# Patient Record
Sex: Female | Born: 1960 | Race: White | Hispanic: No | Marital: Married | Smoking: Never smoker
Health system: Southern US, Community
[De-identification: ages and names within clinical notes are randomized; demographics above are authoritative.]

## PROBLEM LIST (undated history)

## (undated) DIAGNOSIS — J45909 Unspecified asthma, uncomplicated: Secondary | ICD-10-CM

## (undated) DIAGNOSIS — E7800 Pure hypercholesterolemia, unspecified: Secondary | ICD-10-CM

## (undated) HISTORY — PX: TONSILLECTOMY: SUR1361

## (undated) HISTORY — DX: Pure hypercholesterolemia, unspecified: E78.00

---

## 1998-03-16 ENCOUNTER — Other Ambulatory Visit: Admission: RE | Admit: 1998-03-16 | Discharge: 1998-03-16 | Payer: Self-pay | Admitting: Obstetrics and Gynecology

## 1999-03-29 ENCOUNTER — Other Ambulatory Visit: Admission: RE | Admit: 1999-03-29 | Discharge: 1999-03-29 | Payer: Self-pay | Admitting: Obstetrics and Gynecology

## 2000-04-18 ENCOUNTER — Other Ambulatory Visit: Admission: RE | Admit: 2000-04-18 | Discharge: 2000-04-18 | Payer: Self-pay | Admitting: Obstetrics and Gynecology

## 2001-04-23 ENCOUNTER — Other Ambulatory Visit: Admission: RE | Admit: 2001-04-23 | Discharge: 2001-04-23 | Payer: Self-pay | Admitting: Obstetrics and Gynecology

## 2001-09-11 ENCOUNTER — Ambulatory Visit (HOSPITAL_COMMUNITY): Admission: RE | Admit: 2001-09-11 | Discharge: 2001-09-11 | Payer: Self-pay | Admitting: Family Medicine

## 2001-09-11 ENCOUNTER — Encounter: Payer: Self-pay | Admitting: Family Medicine

## 2002-07-10 ENCOUNTER — Other Ambulatory Visit: Admission: RE | Admit: 2002-07-10 | Discharge: 2002-07-10 | Payer: Self-pay | Admitting: Obstetrics and Gynecology

## 2003-08-26 ENCOUNTER — Other Ambulatory Visit: Admission: RE | Admit: 2003-08-26 | Discharge: 2003-08-26 | Payer: Self-pay | Admitting: Obstetrics and Gynecology

## 2004-09-29 ENCOUNTER — Other Ambulatory Visit: Admission: RE | Admit: 2004-09-29 | Discharge: 2004-09-29 | Payer: Self-pay | Admitting: Obstetrics and Gynecology

## 2005-11-22 ENCOUNTER — Other Ambulatory Visit: Admission: RE | Admit: 2005-11-22 | Discharge: 2005-11-22 | Payer: Self-pay | Admitting: Obstetrics and Gynecology

## 2007-02-16 ENCOUNTER — Ambulatory Visit (HOSPITAL_COMMUNITY): Admission: RE | Admit: 2007-02-16 | Discharge: 2007-02-16 | Payer: Self-pay | Admitting: Obstetrics and Gynecology

## 2009-04-23 ENCOUNTER — Ambulatory Visit (HOSPITAL_COMMUNITY): Admission: RE | Admit: 2009-04-23 | Discharge: 2009-04-23 | Payer: Self-pay | Admitting: Obstetrics and Gynecology

## 2010-10-18 ENCOUNTER — Encounter: Payer: Self-pay | Admitting: Obstetrics and Gynecology

## 2011-01-02 LAB — CBC
HCT: 37.8 % (ref 36.0–46.0)
Hemoglobin: 13.2 g/dL (ref 12.0–15.0)
MCHC: 35 g/dL (ref 30.0–36.0)
MCV: 96.9 fL (ref 78.0–100.0)
Platelets: 214 10*3/uL (ref 150–400)
RBC: 3.91 MIL/uL (ref 3.87–5.11)
RDW: 13 % (ref 11.5–15.5)
WBC: 8 10*3/uL (ref 4.0–10.5)

## 2011-01-02 LAB — COMPREHENSIVE METABOLIC PANEL
ALT: 15 U/L (ref 0–35)
AST: 17 U/L (ref 0–37)
Albumin: 4.3 g/dL (ref 3.5–5.2)
Alkaline Phosphatase: 51 U/L (ref 39–117)
BUN: 11 mg/dL (ref 6–23)
CO2: 29 mEq/L (ref 19–32)
Calcium: 9.5 mg/dL (ref 8.4–10.5)
Chloride: 101 mEq/L (ref 96–112)
Creatinine, Ser: 0.75 mg/dL (ref 0.4–1.2)
GFR calc Af Amer: >60 mL/min (ref >60)
GFR calc non Af Amer: >60 mL/min (ref >60)
Glucose, Bld: 87 mg/dL (ref 70–99)
Potassium: 3.7 mEq/L (ref 3.5–5.1)
Sodium: 136 mEq/L (ref 135–145)
Total Bilirubin: 1.6 mg/dL — ABNORMAL HIGH (ref 0.3–1.2)
Total Protein: 7.4 g/dL (ref 6.0–8.3)

## 2011-01-02 LAB — PREGNANCY, URINE: Preg Test, Ur: NEGATIVE

## 2011-02-08 NOTE — Op Note (Signed)
NAMEMarland Kitchen  Marissa Jensen, Marissa Jensen            ACCOUNT NO.:  [id number]   MEDICAL RECORD NO.:  [id number]          PATIENT TYPE:  AMB   LOCATION:  SDC                           FACILITY:  [facility]   PHYSICIAN:  Duke Salvia. Marcelle Overlie, M.D.DATE OF BIRTH:  06/03/1961   DATE OF PROCEDURE:  04/23/2009  DATE OF DISCHARGE:  04/23/2009                               OPERATIVE REPORT   PREOPERATIVE DIAGNOSIS:  Possible left adnexal mass.   POSTOPERATIVE DIAGNOSIS:  Minimal endometriosis.  No mass noted.   PROCEDURE:  Diagnostic laparoscopy, cystoscopy.   SURGEON:  Duke Salvia. Marcelle Overlie, MD   ANESTHESIA:  General endotracheal.   COMPLICATIONS:  None.   DRAINS:  In-and-out Foley catheter.   BLOOD LOSS:  Minimal.   PROCEDURE AND FINDINGS:  The patient was taken to the operating room.  After an adequate level of general endotracheal anesthesia was obtained  with the patient's legs in stirrups, the abdomen, perineum, and vagina  prepped and draped in usual manner for laparoscopy.  The bladder was  drained.  Careful EUA was carried out first with a speculum exam.  There  was not a Gartner's duct cyst or any other abnormality I could detect on  exam, especially on the left fornix area.  The area over the prior TOT  was palpably normal.  There was no exposed mesh on careful exam.  On  EUA, the uterus was anterior, normal size, mobile.  Adnexa unremarkable  up to the sidewall and each side I could not palpate any abnormality.  Hulka tenaculum was positioned.  Attention directed to the abdomen.  The  subumbilical area was infiltrated with 0.25% Marcaine plain.  A small  incision was made and the Veress needle was introduced without  difficulty.  Its intra-abdominal position verified by pressure and water  testing.  After 2.5 L pneumoperitoneum was then created, laparoscopic  trocar and sleeve were then introduced without difficulty.  Three  fingerbreadths above the symphysis in the midline, a 5-mm trocar was  inserted and then the patient placed in Trendelenburg.   Pelvic findings as follows:  The anterior and posterior cul-de-sac were  unremarkable.  There was some minimal endometriosis along the surface of  the left ovary.  One implant that was very minimal lateral to the right  uterosacral ligament.  Otherwise, the uterosacral ligaments and the  posterior cul-de-sac area was unremarkable.  The tubes and ovaries  bilaterally were normal.  Careful inspection using the blunt probe as to  facilitate palpation in the broad ligament, I could not detect a mass.  The area described on the left anatomic pelvis even near the bladder  area, I could not detect any abnormality.  There was a solitary  intramural fibroid posteriorly that was relatively small, 1.5 cm.  Otherwise, the serosa was unremarkable.  Careful inspection of the  bladder flap area out to the sidewall including the course of the ureter  on either side, I could not detect any abnormality.  There were some  periappendiceal adhesions and pericecal adhesions on the right may have  suggested a prior episode of a low-grade appendicitis or colitis, but  the appendix itself showed no evidence of any acute inflammation other  than the old adhesions.  After these findings were photo documented,  instruments removed, gas allowed to escape, defect closed with 4-0 Dexon  subcuticular sutures and Dermabond.  At this point, cystoscopy was  carried out with 30-degree lens.  The bladder was insufflated.  At  certain point, the flow was discontinued to allow for visualization.  Careful visualization of the bladder revealed slight irregularity at the  right ureter, but no mass, no polyp.  There were certainly no evidence  of mesh erosion into the bladder or other mass effect noted.  Scope was  removed.  The bladder was deflated.  She tolerated this well, went to  recovery room in good condition.      Richard M. Marcelle Overlie, M.D.  Electronically  Signed     RMH/MEDQ  D:  04/23/2009  T:  04/24/2009  Job:  [id number]

## 2011-02-08 NOTE — H&P (Signed)
NAME:  WAVERLY, TARQUINIO NO.:  [id number]   MEDICAL RECORD NO.:  [id number]           PATIENT TYPE:  AMB   LOCATION:                                [facility]:  [facility]   PHYSICIAN:  Duke Salvia. Marcelle Overlie, M.D.DATE OF BIRTH:  1961-07-02   DATE OF ADMISSION:  04/23/2009  DATE OF DISCHARGE:                              HISTORY & PHYSICAL   DATE OF SCHEDULED SURGERY:  April 23, 2009.   CHIEF COMPLAINT:  Left pelvic mass.   HISTORY OF PRESENT ILLNESS:  A 50 year old G2, P1 using condoms.  She  has a 53 year old daughter.  Last year, she had TOT for SUI and is  incontinent since that time.   Her GI doctor had ordered a CT scan to evaluate for some abdominal  discomfort and CT was read out as normal, except for showing a 3-cm left  adnexal mass that was separate from the ovary.  No other abnormalities  were noted.  We ordered a followup MRI to try to clarify that showed a  2.8 x 2.8 left periadnexal mass that was close to the bladder.  Her  pelvic exam was entirely normal.  Of note that her mother was diagnosed  in 2003 with ovarian cancer.  She presents at this time for diagnostic  laparoscopy and cystoscopy.  This procedure including risks related to  bleeding, infection, adjacent organ injury, the possible need for open  additional surgery all reviewed with her, which she understands and  accepts.   PAST MEDICAL HISTORY:   ALLERGIES:  None.   CURRENT MEDICATIONS:  Vytorin, calcium, and vitamin D.   OBSTETRICAL HISTORY:  One vaginal delivery in 1995, she had a TOT in  2008, a D and C in 1994.   REVIEW OF SYSTEMS:  Significant for hiatal hernia, history of IBS.   FAMILY HISTORY:  Significant for osteoporosis, diverticulosis, colon and  ovarian cancer.   SOCIAL HISTORY:  Denies smoking, alcohol, or drug use.  She is married,  has 1 child, age 32.   PHYSICAL EXAMINATION:  VITAL SIGNS:  Temperature 98.2, blood pressure  120/78.  HEENT:  Unremarkable.  NECK:  Supple  without masses.  LUNGS:  Clear.  CARDIOVASCULAR:  Regular rate and rhythm without murmurs, rubs, or  gallops.  BREASTS:  Without masses.  ABDOMEN:  Soft, flat, nontender.  PELVIC:  Normal external genitalia.  Vagina and cervix clear.  Uterus  mid positional size.  Adnexa negative.  No unusual tenderness nodularity  noted on careful exam.  She had colonoscopy done 4 years ago.   IMPRESSION:  Asymptomatic left periadnexal mass noted on CT and MRI.   PLAN:  Diagnostic laparoscopy with cystoscopy procedure and risks  reviewed as above.      Richard M. Marcelle Overlie, M.D.  Electronically Signed     RMH/MEDQ  D:  04/21/2009  T:  04/21/2009  Job:  [id number]

## 2011-02-11 NOTE — H&P (Signed)
NAMEMarland Jensen  ALYSSAMAE, KLINCK            ACCOUNT NO.:  [id number]   MEDICAL RECORD NO.:  [id number]          PATIENT TYPE:  AMB   LOCATION:  SDC                           [facility]   PHYSICIAN:  Duke Salvia. Marcelle Overlie, M.D.DATE OF BIRTH:  [birth year]   DATE OF ADMISSION:  DATE OF DISCHARGE:                              HISTORY & PHYSICAL   DATE OF SCHEDULED SURGERY:  Feb 16, 2007.   CHIEF COMPLAINT:  Stress urinary incontinence.   HISTORY OF PRESENT ILLNESS:  This 50 year old G2, P1 is using condoms  for contraception.  Does not plan another pregnancy.  The last year or  two she has noticed worsening problems with SUI.  Urodynamics in our  office was consistent with SUI.  She presents now for TOT.  This  procedure including risk of bleeding, infection, adjacent organ injury,  bladder irritability issues or the need for catheterization postop along  with her expected recovery time all discussed, which she understands and  accepts.   ALLERGIES:  NONE.   CURRENT MEDICATIONS:  MiraLax p.r.n., Nexium.   FAMILY HISTORY:  Significant for a mother who died with ovarian cancer.   REVIEW OF SYSTEMS:  She has had one prior miscarriage, one term  pregnancy, tonsillectomy, was diagnosed with a hiatal hernia in 1993 by  Dr. Jarold Motto.   PHYSICAL EXAMINATION:  VITAL SIGNS:  Temperature 98.2, blood pressure  120/78.  HEENT: Unremarkable.  NECK:  Supple without masses.  LUNGS:  Clear.  CARDIOVASCULAR:  Regular rate and rhythm without murmurs, rubs or  gallops.  BREASTS:  Without masses.  ABDOMEN:  Soft, flat, nontender.  PELVIC:  Normal external genitalia.  Vagina chamber is clear.  Uterus in  good position, normal size.  Adnexa negative.  EXTREMITIES:  Unremarkable.  NEUROLOGIC:  Unremarkable.   IMPRESSION:  Stress urinary incontinence.   PLAN:  TOT procedure and risk reviewed as above.      Richard M. Marcelle Overlie, M.D.  Electronically Signed     RMH/MEDQ  D:  02/15/2007  T:   02/15/2007  Job:  [id number]

## 2011-02-11 NOTE — Op Note (Signed)
NAMEMarland Jensen  ILDA, LASKIN            ACCOUNT NO.:  [id number]   MEDICAL RECORD NO.:  [id number]          PATIENT TYPE:  AMB   LOCATION:  SDC                           [facility]   PHYSICIAN:  Duke Salvia. Marcelle Overlie, M.D.DATE OF BIRTH:  1961-03-10   DATE OF PROCEDURE:  02/16/2007  DATE OF DISCHARGE:                               OPERATIVE REPORT   PREOPERATIVE DIAGNOSIS:  Stress urinary incontinence.   POSTOPERATIVE DIAGNOSIS:  Stress urinary incontinence.   PROCEDURE:  Transobturator tape sling, cystoscopy.   SURGEON:  [place].   ANESTHESIA:  General.   COMPLICATIONS:  None.   DRAINS:  In-and-out Foley catheter.   BLOOD LOSS:  Minimal.   PROCEDURE AND FINDINGS:  The patient was taken to the operating room  after an adequate level of general endotracheal anesthesia was obtained  with the patient's legs stirrups.  The lower abdomen, perineum and  vagina were prepped and draped in the usual manner for vaginal  procedures.  Weighted speculum was positioned.  The bladder was drained  at that point.  By palpation, marks were made at the junction of the  adductor longus muscle on the inferior ramus of the inferior pubic  ramus.  Small incisions were made in those areas and infiltrated with 1%  Xylocaine with epinephrine.  Next, a small incision was made at the mid  urethra in the midline, approximately 2 cm dissected a short way on each  side to allow passage of the surgeon's finger into the appropriate  space.  The halo needle was then positioned, passed through one side and  then the other.  Careful attention made that there was no buttonholing  of the vaginal mucosa.  Once the needles were positioned correctly,  cystoscopy was carried out revealing no entry into the bladder.  The  mesh was pulled through, was tensioned appropriately and trimmed at the  exit sites.  The exit sites were glued with Dermabond assuring that the  mesh was below skin level and the vaginal mucosa  reapproximated with 2-0  Vicryl Rapide sutures.  The bladder was partially drained at that point.  Clear urine was noted.  She tolerated this well, went to recovery room  in good condition.      Richard M. Marcelle Overlie, M.D.  Electronically Signed     RMH/MEDQ  D:  02/16/2007  T:  02/16/2007  Job:  [id number]

## 2013-07-25 ENCOUNTER — Other Ambulatory Visit: Payer: Self-pay | Admitting: Obstetrics and Gynecology

## 2013-07-25 DIAGNOSIS — R928 Other abnormal and inconclusive findings on diagnostic imaging of breast: Secondary | ICD-10-CM

## 2013-07-30 ENCOUNTER — Other Ambulatory Visit: Payer: Self-pay

## 2013-08-13 ENCOUNTER — Other Ambulatory Visit: Payer: Self-pay

## 2014-08-20 ENCOUNTER — Encounter: Payer: Self-pay | Admitting: Podiatrist

## 2014-08-20 ENCOUNTER — Ambulatory Visit (INDEPENDENT_AMBULATORY_CARE_PROVIDER_SITE_OTHER): Payer: BC Managed Care – PPO | Admitting: Podiatrist

## 2014-08-20 ENCOUNTER — Ambulatory Visit (INDEPENDENT_AMBULATORY_CARE_PROVIDER_SITE_OTHER): Payer: BC Managed Care – PPO

## 2014-08-20 DIAGNOSIS — M7751 Other enthesopathy of right foot: Secondary | ICD-10-CM

## 2014-08-20 DIAGNOSIS — R52 Pain, unspecified: Secondary | ICD-10-CM

## 2014-08-20 DIAGNOSIS — M778 Other enthesopathies, not elsewhere classified: Secondary | ICD-10-CM

## 2014-08-20 DIAGNOSIS — M779 Enthesopathy, unspecified: Secondary | ICD-10-CM

## 2014-08-20 NOTE — Progress Notes (Signed)
   Subjective:    Patient ID: Marissa Jensen, female    DOB: 1961/01/25, 53 y.o.   MRN: [id number]  HPI  PT STATED RT TOP FOOT IS BEEN ACHING FOR 1 YEAR. FOOT IS GETTING WORSE ESPECIALLY WEARING HIGH HEELS. TRIED INSERTS BUT NO HELP.  Review of Systems  Constitutional: Positive for fatigue and unexpected weight change.  Genitourinary: Positive for frequency.       Objective:   Physical Exam Patient is awake, alert, and oriented x 3.  In no acute distress.  Vascular status is intact with palpable pedal pulses at 2/4 DP and PT bilateral and capillary refill time within normal limits. Neurological sensation is also intact bilaterally via Semmes Weinstein monofilament at 5/5 sites. Light touch, vibratory sensation, Achilles tendon reflex is intact. Dermatological exam reveals skin color, turger and texture as normal. No open lesions present.  Musculature intact with dorsiflexion, plantarflexion, inversion, eversion.  Pain at the first metatarsal phalangeal joint region is present.  Discomfort is mostly with heel elevation of 2 inches and above.  Decreased range of motion is present.  Mild decreased joint space is noted on xray with an elevated first metatarsal seen on the lateral view.      Assessment & Plan:  Hallux limitus  Plan:  Discussed conservative options .  Discussed custom orthotics, injection therapy, shoe gear changes, antiinflammatories.  Will follow up for a recheck.

## 2015-06-25 ENCOUNTER — Ambulatory Visit (INDEPENDENT_AMBULATORY_CARE_PROVIDER_SITE_OTHER): Payer: BLUE CROSS/BLUE SHIELD

## 2015-06-25 ENCOUNTER — Ambulatory Visit (INDEPENDENT_AMBULATORY_CARE_PROVIDER_SITE_OTHER): Payer: BLUE CROSS/BLUE SHIELD | Admitting: Podiatry

## 2015-06-25 ENCOUNTER — Encounter: Payer: Self-pay | Admitting: Podiatry

## 2015-06-25 VITALS — BP 119/69 | HR 76 | Resp 16

## 2015-06-25 DIAGNOSIS — M79672 Pain in left foot: Secondary | ICD-10-CM

## 2015-06-25 DIAGNOSIS — M79671 Pain in right foot: Secondary | ICD-10-CM

## 2015-06-25 DIAGNOSIS — D3610 Benign neoplasm of peripheral nerves and autonomic nervous system, unspecified: Secondary | ICD-10-CM

## 2015-06-25 DIAGNOSIS — M779 Enthesopathy, unspecified: Secondary | ICD-10-CM | POA: Diagnosis not present

## 2015-06-25 MED ORDER — TRIAMCINOLONE ACETONIDE 10 MG/ML IJ SUSP
10.0000 mg | Freq: Once | INTRAMUSCULAR | Status: AC
  Administered 2015-06-25: 10 mg

## 2015-06-25 MED ORDER — MELOXICAM 15 MG PO TABS: 15.0000 mg | ORAL_TABLET | Freq: Every day | ORAL | Status: DC

## 2015-06-25 NOTE — Progress Notes (Signed)
Subjective:     Patient ID: Marissa Jensen, female   DOB: 04-Jul-1961, 54 y.o.   MRN: [id number]  HPI patient states I'm getting some burning in the top of my left foot for around a month and the big toe joint on my right has been increasingly symptomatically the last few months and I got approximate 4 months of relief from the previous injection 10 months ago   Review of Systems     Objective:   Physical Exam Neurovascular status intact muscle strength adequate with discomfort and inflammation of the right first MPJ that is localized in nature with mild restriction of the joint but no crepitus and moderate discomfort in the left forefoot around the third intermetatarsal space and the lateral side of the foot    Assessment:     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 Today I went ahead and I did careful injection of the first MPJ right 3 mg Kenalog 5 mg Xylocaine Marcaine mixture and instructed on oral mobile big therapy. I then discussed wider shoes and gave her information on neuromas and explained robust to her and the treatment we may need to undertake. Patient will be seen back to reevaluate as symptoms show and ultimately will probably need surgery on the right foot

## 2015-06-25 NOTE — Patient Instructions (Signed)
Morton's Neuroma Neuralgia (nerve pain) or neuroma (benign [non-cancerous] nerve tumor) may develop on any interdigital nerve. The interdigital nerves (nerves between digits) of the foot travel beneath and between the metatarsals (long bones of the fore foot) and pass the nerve endings to the toes. The third interdigital is a common place for a small neuroma to form called Morton's neuroma. Another nerve to be affected commonly is the fourth interdigital nerve. This would be in approximately in the area of the base or ball under the bottom of your fourth toe. This condition occurs more commonly in women and is usually on one side. It is usually first noticed by pain radiating (spreading) to the ball of the foot or to the toes. CAUSES The cause of interdigital neuralgia may be from low grade repetitive trauma (damage caused by an accident) as in activities causing a repeated pounding of the foot (running, jumping etc.). It is also caused by improper footwear or recent loss of the fatty padding on the bottom of the foot. TREATMENT  The condition often resolves (goes away) simply with decreasing activity if that is thought to be the cause. Proper shoes are beneficial. Orthotics (special foot support aids) such as a metatarsal bar are often beneficial. This condition usually responds to conservative therapy, however if surgery is necessary it usually brings complete relief. HOME CARE INSTRUCTIONS   Apply ice to the area of soreness for 15-20 minutes, 03-04 times per day, while awake for the first 2 days. Put ice in a plastic bag and place a towel between the bag of ice and your skin.  Only take over-the-counter or prescription medicines for pain, discomfort, or fever as directed by your caregiver. MAKE SURE YOU:   Understand these instructions.  Will watch your condition.  Will get help right away if you are not doing well or get worse. Document Released: 12/19/2000 Document Revised: 12/05/2011  Document Reviewed: 11/13/2013 [employer] Patient Information 2015 [employer]. This information is not intended to replace advice given to you by your health care provider. Make sure you discuss any questions you have with your health care provider.  

## 2016-03-28 ENCOUNTER — Encounter (HOSPITAL_BASED_OUTPATIENT_CLINIC_OR_DEPARTMENT_OTHER): Payer: Self-pay | Admitting: *Deleted

## 2016-03-28 ENCOUNTER — Emergency Department (HOSPITAL_BASED_OUTPATIENT_CLINIC_OR_DEPARTMENT_OTHER): Payer: BLUE CROSS/BLUE SHIELD

## 2016-03-28 ENCOUNTER — Emergency Department (HOSPITAL_BASED_OUTPATIENT_CLINIC_OR_DEPARTMENT_OTHER)
Admission: EM | Admit: 2016-03-28 | Discharge: 2016-03-28 | Disposition: A | Discharge status: Home / Self Care | Payer: BLUE CROSS/BLUE SHIELD | Attending: Emergency Medicine | Admitting: Emergency Medicine

## 2016-03-28 DIAGNOSIS — E7800 Pure hypercholesterolemia, unspecified: Secondary | ICD-10-CM | POA: Diagnosis not present

## 2016-03-28 DIAGNOSIS — R05 Cough: Secondary | ICD-10-CM | POA: Diagnosis not present

## 2016-03-28 DIAGNOSIS — Z7951 Long term (current) use of inhaled steroids: Secondary | ICD-10-CM | POA: Insufficient documentation

## 2016-03-28 DIAGNOSIS — R062 Wheezing: Secondary | ICD-10-CM | POA: Insufficient documentation

## 2016-03-28 DIAGNOSIS — Z79899 Other long term (current) drug therapy: Secondary | ICD-10-CM | POA: Diagnosis not present

## 2016-03-28 DIAGNOSIS — R0602 Shortness of breath: Secondary | ICD-10-CM | POA: Diagnosis present

## 2016-03-28 DIAGNOSIS — R059 Cough, unspecified: Secondary | ICD-10-CM

## 2016-03-28 MED ORDER — PREDNISONE 20 MG PO TABS: ORAL_TABLET | ORAL | Status: DC

## 2016-03-28 MED ORDER — BENZONATATE 100 MG PO CAPS: 100.0000 mg | ORAL_CAPSULE | Freq: Three times a day (TID) | ORAL | Status: DC

## 2016-03-28 MED ORDER — PREDNISONE 10 MG PO TABS
60.0000 mg | ORAL_TABLET | Freq: Once | ORAL | Status: AC
  Administered 2016-03-28: 60 mg via ORAL
  Filled 2016-03-28: qty 1

## 2016-03-28 MED ORDER — BENZONATATE 100 MG PO CAPS
200.0000 mg | ORAL_CAPSULE | Freq: Once | ORAL | Status: AC
  Administered 2016-03-28: 200 mg via ORAL
  Filled 2016-03-28: qty 2

## 2016-03-28 MED ORDER — CETIRIZINE-PSEUDOEPHEDRINE ER 5-120 MG PO TB12: 1.0000 | ORAL_TABLET | Freq: Every day | ORAL | Status: DC

## 2016-03-28 NOTE — ED Notes (Signed)
Dr. Palumbo at [facility] 

## 2016-03-28 NOTE — ED Notes (Signed)
[facility] at [facility].

## 2016-03-28 NOTE — ED Notes (Signed)
Steady gait to xray. Alert, NAD, calm, interactive, denies pain, c/o sob and cough.

## 2016-03-28 NOTE — ED Notes (Signed)
Pt c/o bronchitis type symptoms x2 weeks. She sts she has had intermittent episodes of sob and wheezing. Pt also c/o non=productive cough. She was seen by her MD 4 days ago and put on levaquin, cough syrup with codeine and an inhaler. Pt sts no relief. Pt did not get a cxr at PCP.

## 2016-03-28 NOTE — Discharge Instructions (Signed)

## 2016-03-28 NOTE — ED Provider Notes (Signed)
CSN: [id number]     Arrival date & time 03/28/16  0335 History   First MD Initiated Contact with Patient 03/28/16 [phone]     Chief Complaint  Patient presents with  . Shortness of Breath     (Consider location/radiation/quality/duration/timing/severity/associated sxs/prior Treatment) Patient is a 55 y.o. female presenting with cough. The history is provided by the patient.  Cough Cough characteristics:  Non-productive Severity:  Moderate Onset quality:  Gradual Duration:  2 weeks Timing:  Intermittent Progression:  Unchanged Chronicity:  New Smoker: no   Context: not animal exposure   Relieved by:  Nothing Worsened by:  Nothing tried Ineffective treatments:  Beta-agonist inhaler and cough suppressants (and levaquin.  Is on hydromet cough syrup) Associated symptoms: wheezing   Associated symptoms: no chest pain, no diaphoresis and no fever   Risk factors: no recent travel     Past Medical History  Diagnosis Date  . High cholesterol    Past Surgical History  Procedure Laterality Date  . Tonsillectomy    . Cesarean section     No family history on file. Social History  Substance Use Topics  . Smoking status: Never Smoker   . Smokeless tobacco: None  . Alcohol Use: No   OB History    No data available     Review of Systems  Constitutional: Negative for fever and diaphoresis.  Respiratory: Positive for cough and wheezing.   Cardiovascular: Negative for chest pain, palpitations and leg swelling.  All other systems reviewed and are negative.     Allergies  Review of patient's allergies indicates no known allergies.  Home Medications   Prior to Admission medications   Medication Sig Start Date End Date Taking? Authorizing Provider  albuterol (PROVENTIL HFA;VENTOLIN HFA) 108 (90 Base) MCG/ACT inhaler Inhale 2 puffs into the lungs every 6 (six) hours as needed for wheezing or shortness of breath.   Yes Historical Provider, MD  levofloxacin (LEVAQUIN) 500 MG tablet  Take 500 mg by mouth daily.   Yes Historical Provider, MD  atorvastatin (LIPITOR) 40 MG tablet Take 40 mg by mouth. 08/07/14   Historical Provider, MD  benzonatate (TESSALON) 100 MG capsule Take 1 capsule (100 mg total) by mouth every 8 (eight) hours. 03/28/16   Kaiesha Tonner, MD  cetirizine-pseudoephedrine (ZYRTEC-D) 5-120 MG tablet Take 1 tablet by mouth daily. 03/28/16   Humza Tallerico, MD  esomeprazole (NEXIUM) 40 MG capsule Take 40 mg by mouth. 01/23/14   Historical Provider, MD  iron polysaccharides (NIFEREX) 150 MG capsule Take 150 mg by mouth daily.    Historical Provider, MD  meloxicam (MOBIC) 15 MG tablet Take 1 tablet (15 mg total) by mouth daily. 06/25/15   Wallene Huh, DPM  predniSONE (DELTASONE) 20 MG tablet 3 tabs po day one, then 2 po daily x 4 days 03/28/16   Shahla Betsill, MD   BP 103/64 mmHg  Pulse 83  Temp(Src) 98 F (36.7 C) (Oral)  Resp 20  Ht '5\' 4"'  (1.626 m)  Wt 153 lb (69.4 kg)  BMI 26.25 kg/m2  SpO2 96% Physical Exam  Constitutional: She is oriented to person, place, and time. She appears well-developed and well-nourished. No distress.  HENT:  Head: Normocephalic and atraumatic.  Mouth/Throat: Oropharynx is clear and moist.  Eyes: Conjunctivae are normal. Pupils are equal, round, and reactive to light.  Neck: Normal range of motion. Neck supple.  Cardiovascular: Normal rate, regular rhythm and intact distal pulses.   Pulmonary/Chest: Effort normal and breath sounds normal. No  respiratory distress. She has no wheezes. She has no rales.  Abdominal: Soft. Bowel sounds are normal. There is no tenderness. There is no rebound and no guarding.  Musculoskeletal: Normal range of motion.  Neurological: She is alert and oriented to person, place, and time.  Skin: Skin is warm and dry.  Psychiatric: She has a normal mood and affect.    ED Course  Procedures (including critical care time) Labs Review Labs Reviewed - No data to display  Imaging Review Dg Chest 2  View  03/28/2016  CLINICAL DATA:  Acute onset of shortness of breath and wheezing. Nonproductive cough. Initial encounter. EXAM: CHEST  2 VIEW COMPARISON:  None. FINDINGS: The lungs are well-aerated and clear. There is no evidence of focal opacification, pleural effusion or pneumothorax. The heart is normal in size; the mediastinal contour is within normal limits. No acute osseous abnormalities are seen. IMPRESSION: No acute cardiopulmonary process seen. Electronically Signed   By: Garald Balding M.D.   On: 03/28/2016 04:53   I have personally reviewed and evaluated these images and lab results as part of my medical decision-making.   EKG Interpretation None      MDM   Final diagnoses:  Cough   Filed Vitals:   03/28/16 0345 03/28/16 0400  BP: 115/57 103/64  Pulse: 91 83  Temp:    Resp:     Results for orders placed or performed during the hospital encounter of 04/23/09  CBC  Result Value Ref Range   WBC 8.0 4.0 - 10.5 K/uL   RBC 3.91 3.87 - 5.11 MIL/uL   Hemoglobin 13.2 12.0 - 15.0 g/dL   HCT 37.8 36.0 - 46.0 %   MCV 96.9 78.0 - 100.0 fL   MCHC 35.0 30.0 - 36.0 g/dL   RDW 13.0 11.5 - 15.5 %   Platelets 214 150 - 400 K/uL  Comprehensive metabolic panel  Result Value Ref Range   Sodium 136 135 - 145 mEq/L   Potassium 3.7 3.5 - 5.1 mEq/L   Chloride 101 96 - 112 mEq/L   CO2 29 19 - 32 mEq/L   Glucose, Bld 87 70 - 99 mg/dL   BUN 11 6 - 23 mg/dL   Creatinine, Ser 0.75 0.4 - 1.2 mg/dL   Calcium 9.5 8.4 - 10.5 mg/dL   Total Protein 7.4 6.0 - 8.3 g/dL   Albumin 4.3 3.5 - 5.2 g/dL   AST 17 0 - 37 U/L   ALT 15 0 - 35 U/L   Alkaline Phosphatase 51 39 - 117 U/L   Total Bilirubin 1.6 (H) 0.3 - 1.2 mg/dL   GFR calc non Af Amer >60 >60 mL/min   GFR calc Af Amer  >60 mL/min    >60        The eGFR has been calculated using the MDRD equation. This calculation has not been validated in all clinical situations. eGFR's persistently <60 mL/min signify possible Chronic Kidney  Disease.  Pregnancy, urine  Result Value Ref Range   Preg Test, Ur      NEGATIVE        THE SENSITIVITY OF THIS METHODOLOGY IS >24 mIU/mL   Dg Chest 2 View  03/28/2016  CLINICAL DATA:  Acute onset of shortness of breath and wheezing. Nonproductive cough. Initial encounter. EXAM: CHEST  2 VIEW COMPARISON:  None. FINDINGS: The lungs are well-aerated and clear. There is no evidence of focal opacification, pleural effusion or pneumothorax. The heart is normal in size; the mediastinal contour is  within normal limits. No acute osseous abnormalities are seen. IMPRESSION: No acute cardiopulmonary process seen. Electronically Signed   By: Garald Balding M.D.   On: 03/28/2016 04:53    Medications  benzonatate (TESSALON) capsule 200 mg (200 mg Oral Given 03/28/16 0419)  predniSONE (DELTASONE) tablet 60 mg (60 mg Oral Given 03/28/16 0452)    Patient given a spacer in the ED and Ginny from [facility] did an albuterol teach and treat with patient's inhaler and new spacer  Patient reports improvement in the cough with Tessalon.    Stop hydromet. Finish Levaquin,  Will start prednisone taper and zyrtec D to dry up any secretions.   Will given RX for tessalon for PRN usage.   Follow up with your PMD.  Strict return precautions given   Christean Silvestri, MD 03/28/16 1540

## 2019-12-04 ENCOUNTER — Encounter: Payer: Self-pay | Admitting: *Deleted

## 2019-12-04 ENCOUNTER — Other Ambulatory Visit: Payer: Self-pay

## 2019-12-04 ENCOUNTER — Emergency Department (INDEPENDENT_AMBULATORY_CARE_PROVIDER_SITE_OTHER): Payer: BC Managed Care – PPO

## 2019-12-04 ENCOUNTER — Emergency Department
Admission: EM | Admit: 2019-12-04 | Discharge: 2019-12-04 | Disposition: A | Discharge status: Home / Self Care | Payer: BC Managed Care – PPO | Source: Home / Self Care

## 2019-12-04 DIAGNOSIS — R0781 Pleurodynia: Secondary | ICD-10-CM

## 2019-12-04 DIAGNOSIS — R0789 Other chest pain: Secondary | ICD-10-CM | POA: Diagnosis not present

## 2019-12-04 NOTE — Discharge Instructions (Signed)
  You may take 500mg  acetaminophen every 4-6 hours or in combination with ibuprofen 400-600mg  every 6-8 hours as needed for pain and inflammation.  You may also alternate cool and warm compresses for comfort.  Please follow up with your family doctor in 1 week if not improving, sooner if pain is worsening, you develop, cough, fever, difficulty breathing or other symptoms develop.

## 2019-12-04 NOTE — ED Triage Notes (Signed)
Patient reports leaning over into backseat of her car 4 days ago putting pressure on her right ribs. C/o pain ever since. Reports she did the same to her left side a couple of years ago. Using salon pas pain patch. Painful to cough, sneeze and take deep breaths.

## 2019-12-04 NOTE — ED Provider Notes (Signed)
[facility]    CSN: [id number] Arrival date & time: 12/04/19  1006      History   Chief Complaint Chief Complaint  Patient presents with   Chest Pain    Right Rib Cage    HPI Marissa Jensen is a 59 y.o. female.   HPI  Marissa Jensen is a 59 y.o. female presenting to UC with c/o 4 days of mild Right lower anterior rib pain after leaning into the back seat of her car, putting pressure on her ribs.  Pain suddenly worsened last night, sharp pain with movement, cough and breathing.  Pain is 7/10 at worst, made it difficult to sleep last night.  Denies abdominal pain, n/v/d.    Past Medical History:  Diagnosis Date   High cholesterol     There are no problems to display for this patient.   Past Surgical History:  Procedure Laterality Date   CESAREAN SECTION     TONSILLECTOMY      OB History   No obstetric history on file.      Home Medications    Prior to Admission medications   Medication Sig Start Date End Date Taking? Authorizing Provider  omeprazole (PRILOSEC) 40 MG capsule omeprazole 40 mg capsule,delayed release  TAKE ONE CAPSULE BY MOUTH DAILY 04/12/17  Yes [provider]  albuterol (PROVENTIL HFA;VENTOLIN HFA) 108 (90 Base) MCG/ACT inhaler Inhale 2 puffs into the lungs every 6 (six) hours as needed for wheezing or shortness of breath.    [provider]  atorvastatin (LIPITOR) 40 MG tablet Take 40 mg by mouth. 08/07/14   [provider]  atorvastatin (LIPITOR) 80 MG tablet Take 80 mg by mouth daily. 12/01/19   [provider]  iron polysaccharides (NIFEREX) 150 MG capsule Take 150 mg by mouth daily.    [provider]  zolpidem (AMBIEN) 10 MG tablet Take 10 mg by mouth at bedtime. 08/31/19   [provider]    Family History History reviewed. No pertinent family history.  Social History Social History   Tobacco Use   Smoking status: Never Smoker  Substance Use Topics   Alcohol  use: No   Drug use: No     Allergies   Patient has no known allergies.   Review of Systems Review of Systems  Constitutional: Negative for chills and fever.  Respiratory: Negative for chest tightness and shortness of breath.   Cardiovascular: Positive for chest pain. Negative for palpitations.  Gastrointestinal: Negative for abdominal pain, diarrhea, nausea and vomiting.  Skin: Negative for color change, rash and wound.     Physical Exam Triage Vital Signs ED Triage Vitals  Enc Vitals Group     BP 12/04/19 1025 138/82     Pulse Rate 12/04/19 1025 98     Resp 12/04/19 1025 16     Temp 12/04/19 1025 99.4 F (37.4 C)     Temp Source 12/04/19 1025 Oral     SpO2 12/04/19 1025 98 %     Weight 12/04/19 1026 160 lb (72.6 kg)     Height --      Head Circumference --      Peak Flow --      Pain Score 12/04/19 1025 7     Pain Loc --      Pain Edu? --      Excl. in [place]? --    No data found.  Updated Vital Signs BP 138/82 (BP Location: Right Arm)    Pulse  98    Temp 99.4 F (37.4 C) (Oral)    Resp 16    Wt 160 lb (72.6 kg)    SpO2 98%    BMI 27.46 kg/m   Visual Acuity Right Eye Distance:   Left Eye Distance:   Bilateral Distance:    Right Eye Near:   Left Eye Near:    Bilateral Near:     Physical Exam Vitals and nursing note reviewed.  Constitutional:      General: She is not in acute distress.    Appearance: She is well-developed. She is not toxic-appearing or diaphoretic.  HENT:     Head: Normocephalic and atraumatic.  Cardiovascular:     Rate and Rhythm: Normal rate and regular rhythm.  Pulmonary:     Effort: Pulmonary effort is normal.     Breath sounds: No decreased breath sounds, wheezing, rhonchi or rales.  Chest:    Musculoskeletal:        General: Normal range of motion.     Cervical back: Normal range of motion.  Skin:    General: Skin is warm and dry.  Neurological:     Mental Status: She is alert and oriented to person, place, and time.   Psychiatric:        Behavior: Behavior normal.      UC Treatments / Results  Labs (all labs ordered are listed, but only abnormal results are displayed) Labs Reviewed - No data to display  EKG   Radiology DG Ribs Unilateral W/Chest Right  Result Date: 12/04/2019 CLINICAL DATA:  Right anterior lower rib pain EXAM: RIGHT RIBS AND CHEST - 3+ VIEW COMPARISON:  03/28/2016 FINDINGS: No visible rib fracture. No effusions or pneumothorax. Heart is normal size. Linear densities in the lung bases could reflect scarring or atelectasis. IMPRESSION: No acute bony abnormality.  No visible rib fracture. Bibasilar scarring or atelectasis. Electronically Signed   By: Rolm Baptise M.D.   On: 12/04/2019 10:57    Procedures Procedures (including critical care time)  Medications Ordered in UC Medications - No data to display  Initial Impression / Assessment and Plan / UC Course  I have reviewed the triage vital signs and the nursing notes.  Pertinent labs & imaging results that were available during my care of the patient were reviewed by me and considered in my medical decision making (see chart for details).     Reviewed imaging with pt Reassured no fractures Encouraged conservative tx at home AVS provided  Final Clinical Impressions(s) / UC Diagnoses   Final diagnoses:  Right-sided chest wall pain     Discharge Instructions      You may take 500mg  acetaminophen every 4-6 hours or in combination with ibuprofen 400-600mg  every 6-8 hours as needed for pain and inflammation.  You may also alternate cool and warm compresses for comfort.  Please follow up with your family doctor in 1 week if not improving, sooner if pain is worsening, you develop, cough, fever, difficulty breathing or other symptoms develop.      ED Prescriptions    None     PDMP not reviewed this encounter.   Noe Gens, [place] 12/04/19 1207

## 2020-09-25 ENCOUNTER — Emergency Department (INDEPENDENT_AMBULATORY_CARE_PROVIDER_SITE_OTHER)
Admission: EM | Admit: 2020-09-25 | Discharge: 2020-09-25 | Disposition: A | Discharge status: Home / Self Care | Payer: BC Managed Care – PPO | Source: Home / Self Care

## 2020-09-25 ENCOUNTER — Other Ambulatory Visit: Payer: Self-pay

## 2020-09-25 ENCOUNTER — Encounter: Payer: Self-pay | Admitting: Emergency Medicine

## 2020-09-25 ENCOUNTER — Emergency Department: Admit: 2020-09-25 | Discharge status: Absent without leave | Payer: Self-pay

## 2020-09-25 DIAGNOSIS — L03031 Cellulitis of right toe: Secondary | ICD-10-CM

## 2020-09-25 HISTORY — DX: Unspecified asthma, uncomplicated: J45.909

## 2020-09-25 MED ORDER — CEPHALEXIN 500 MG PO CAPS: 500.0000 mg | ORAL_CAPSULE | Freq: Three times a day (TID) | ORAL | 0 refills | Status: AC

## 2020-09-25 MED ORDER — MUPIROCIN 2 % EX OINT: TOPICAL_OINTMENT | CUTANEOUS | 0 refills | Status: AC

## 2020-09-25 NOTE — Discharge Instructions (Signed)
  Please take antibiotics as prescribed and be sure to complete entire course even if you start to feel better to ensure infection does not come back.  Follow up with primary care or a podiatrist in 3-4 days if not improving, sooner if worsening.

## 2020-09-25 NOTE — ED Provider Notes (Signed)
[facility]    CSN: [id number] Arrival date & time: 09/25/20  1605      History   Chief Complaint Chief Complaint  Patient presents with  . Toe Pain    HPI Marissa Jensen is a 59 y.o. female.   HPI Marissa Jensen is a 59 y.o. female presenting to UC with c/o 5 days of worsening redness, swelling and pain to Right great toe, that started after having a pedicure.  She has use hydrogen peroxide and noticed a small mount of yellow drainage but no significant improvement.    Past Medical History:  Diagnosis Date  . Asthma   . High cholesterol     There are no problems to display for this patient.   Past Surgical History:  Procedure Laterality Date  . CESAREAN SECTION    . TONSILLECTOMY      OB History   No obstetric history on file.      Home Medications    Prior to Admission medications   Medication Sig Start Date End Date Taking? Authorizing Provider  cephALEXin (KEFLEX) 500 MG capsule Take 1 capsule (500 mg total) by mouth 3 (three) times daily for 7 days. 09/25/20 10/02/20 Yes Caitlin Ainley O, PA-C  mirabegron ER (MYRBETRIQ) 25 MG TB24 tablet Take 25 mg by mouth daily.   Yes [provider]  mupirocin ointment (BACTROBAN) 2 % Apply to wound 3 times daily for 5 days 09/25/20  Yes Eshawn Coor O, PA-C  albuterol (PROVENTIL HFA;VENTOLIN HFA) 108 (90 Base) MCG/ACT inhaler Inhale 2 puffs into the lungs every 6 (six) hours as needed for wheezing or shortness of breath.    [provider]  atorvastatin (LIPITOR) 40 MG tablet Take 40 mg by mouth. 08/07/14   [provider]  atorvastatin (LIPITOR) 80 MG tablet Take 80 mg by mouth daily. 12/01/19   [provider]  iron polysaccharides (NIFEREX) 150 MG capsule Take 150 mg by mouth daily.    [provider]  omeprazole (PRILOSEC) 40 MG capsule omeprazole 40 mg capsule,delayed release  TAKE ONE CAPSULE BY MOUTH DAILY 04/12/17   [provider]  zolpidem  (AMBIEN) 10 MG tablet Take 10 mg by mouth at bedtime. 08/31/19   [provider]    Family History No family history on file.  Social History Social History   Tobacco Use  . Smoking status: Never Smoker  Substance Use Topics  . Alcohol use: No  . Drug use: No     Allergies   Patient has no known allergies.   Review of Systems Review of Systems  Constitutional: Negative for chills and fever.  Skin: Positive for color change and wound.     Physical Exam Triage Vital Signs ED Triage Vitals  Enc Vitals Group     BP 09/25/20 1840 (!) 134/9     Pulse Rate 09/25/20 1840 80     Resp 09/25/20 1840 16     Temp 09/25/20 1840 98.7 F (37.1 C)     Temp Source 09/25/20 1840 Oral     SpO2 09/25/20 1840 99 %     Weight 09/25/20 1841 155 lb (70.3 kg)     Height 09/25/20 1841 5\' 4"  (1.626 m)     Head Circumference --      Peak Flow --      Pain Score 09/25/20 1841 7     Pain Loc --      Pain Edu? --      Excl. in  [place]? --    No data found.  Updated Vital Signs BP (!) 134/91   Pulse 80   Temp 98.7 F (37.1 C) (Oral)   Resp 16   Ht 5\' 4"  (1.626 m)   Wt 155 lb (70.3 kg)   SpO2 99%   BMI 26.61 kg/m   Visual Acuity Right Eye Distance:   Left Eye Distance:   Bilateral Distance:    Right Eye Near:   Left Eye Near:    Bilateral Near:     Physical Exam Vitals and nursing note reviewed.  Constitutional:      Appearance: She is well-developed and well-nourished.  HENT:     Head: Normocephalic and atraumatic.  Eyes:     Extraocular Movements: EOM normal.  Cardiovascular:     Rate and Rhythm: Normal rate.  Pulmonary:     Effort: Pulmonary effort is normal.  Musculoskeletal:        General: Swelling and tenderness present. Normal range of motion.     Cervical back: Normal range of motion.     Comments: Right great toe: mild edema, tenderness along medial aspect of nailbed, full ROM  Skin:    General: Skin is warm and dry.     Capillary Refill: Capillary  refill takes less than 2 seconds.     Findings: Erythema present.     Comments: Right great toe: erythema, tenderness to medial aspect of nailbed with scant dried yellow discharge.   Neurological:     Mental Status: She is alert and oriented to person, place, and time.     Sensory: No sensory deficit.  Psychiatric:        Mood and Affect: Mood and affect normal.        Behavior: Behavior normal.      UC Treatments / Results  Labs (all labs ordered are listed, but only abnormal results are displayed) Labs Reviewed - No data to display  EKG   Radiology No results found.  Procedures Procedures (including critical care time)  Medications Ordered in UC Medications - No data to display  Initial Impression / Assessment and Plan / UC Course  I have reviewed the triage vital signs and the nursing notes.  Pertinent labs & imaging results that were available during my care of the patient were reviewed by me and considered in my medical decision making (see chart for details).    Hx and exam c/w paronychia of Right great toe Rx: mupirocin ointment and keflex F/u with PCP as needed  Final Clinical Impressions(s) / UC Diagnoses   Final diagnoses:  Paronychia of great toe of right foot     Discharge Instructions      Please take antibiotics as prescribed and be sure to complete entire course even if you start to feel better to ensure infection does not come back.  Follow up with primary care or a podiatrist in 3-4 days if not improving, sooner if worsening.    ED Prescriptions    Medication Sig Dispense Auth. Provider   mupirocin ointment (BACTROBAN) 2 % Apply to wound 3 times daily for 5 days 22 g Meghan Tiemann O, PA-C   cephALEXin (KEFLEX) 500 MG capsule Take 1 capsule (500 mg total) by mouth 3 (three) times daily for 7 days. 21 capsule 05-08-1987, Lurene Shadow     PDMP not reviewed this encounter.   [place], Lurene Shadow 09/26/20 [phone]

## 2020-09-25 NOTE — ED Triage Notes (Signed)
Patient here for day 5 of pain and redness of right large toe following pedicure; has used H2O2 but no other meds or treatments. Has had covid vaccinations.

## 2020-11-14 IMAGING — DX DG RIBS W/ CHEST 3+V*R*
3 series · 3 of 3 positions shown · non-contrast
Comparison: 03/28/2016

CLINICAL DATA: Right anterior lower rib pain

EXAM:
RIGHT RIBS AND CHEST - 3+ VIEW

[chest pa]
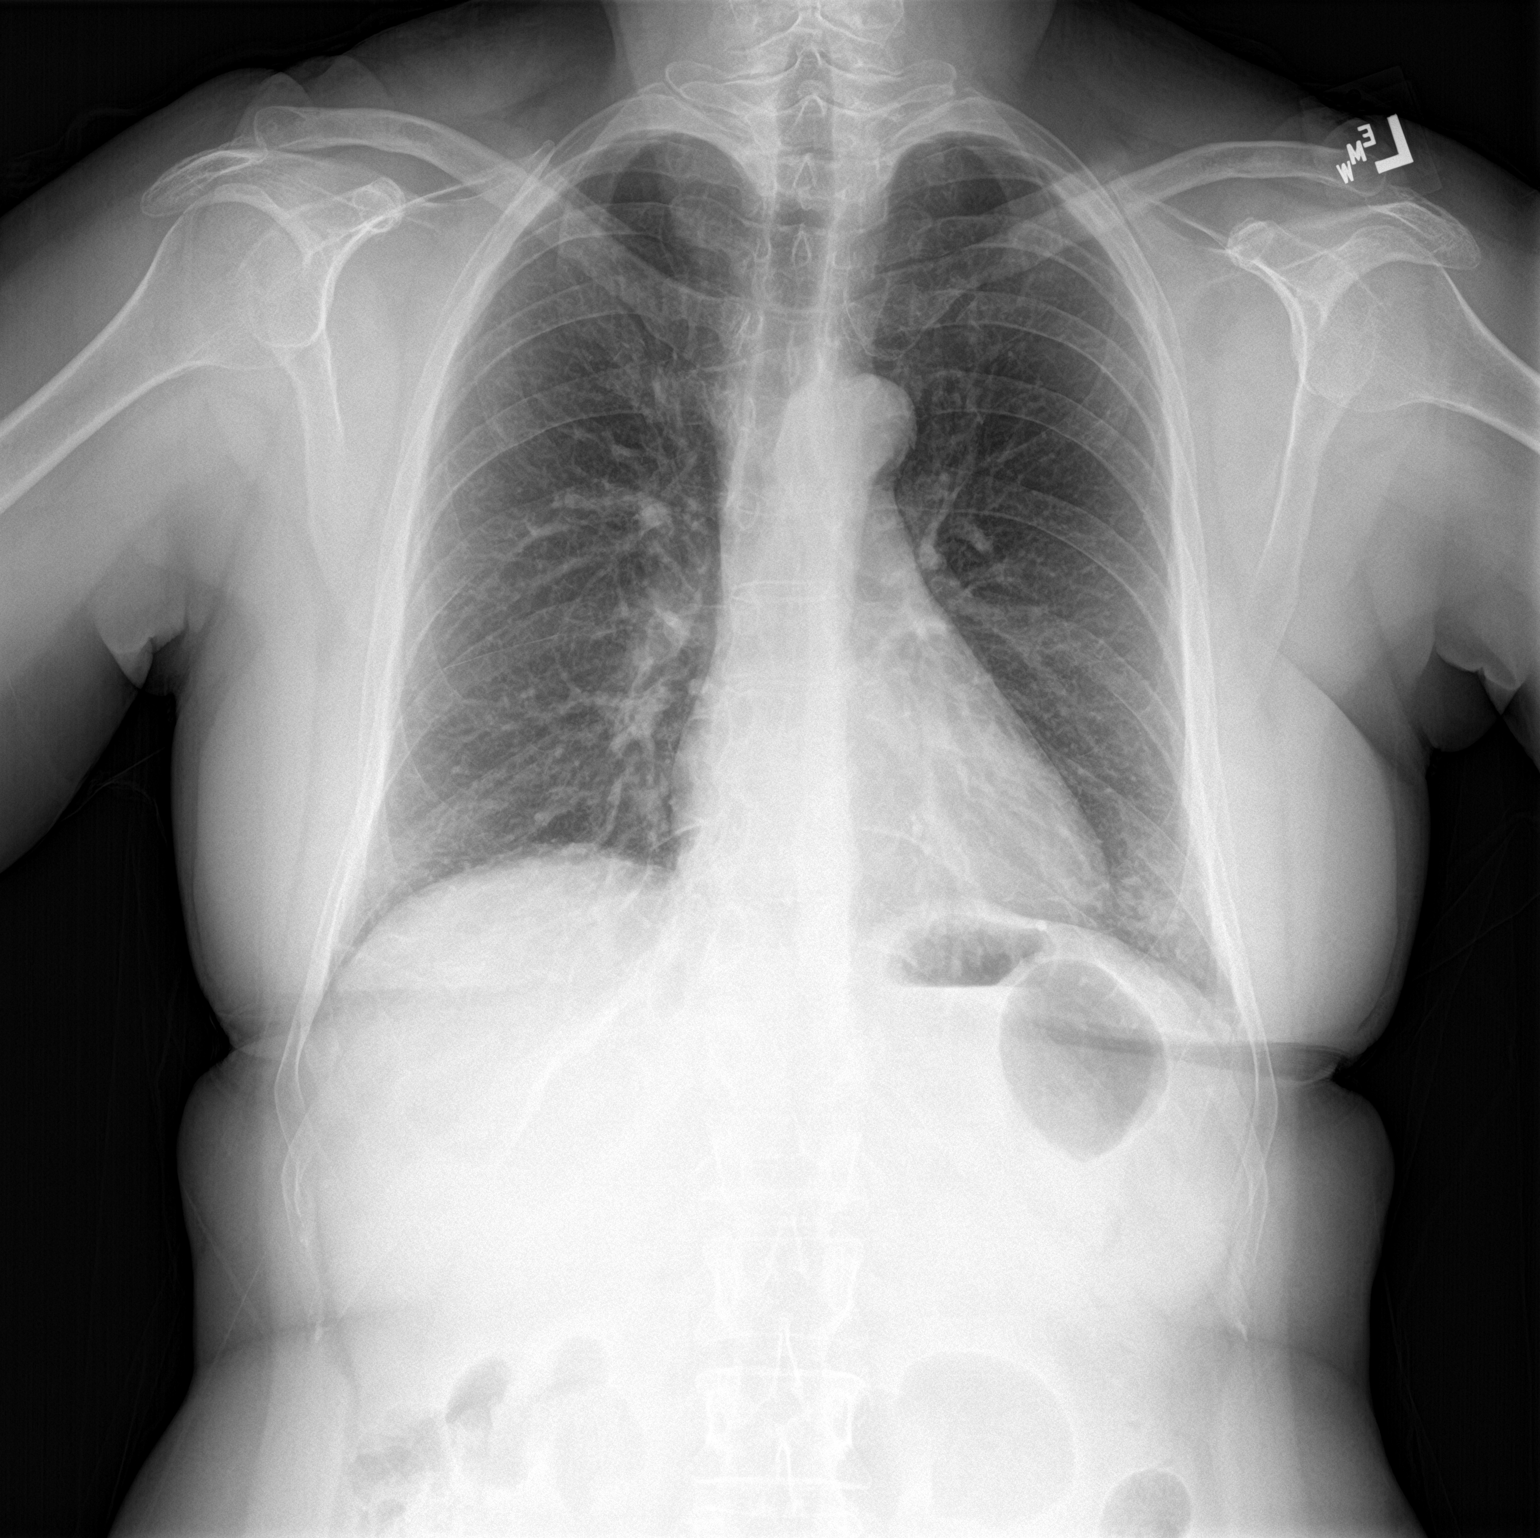

[rib pa]
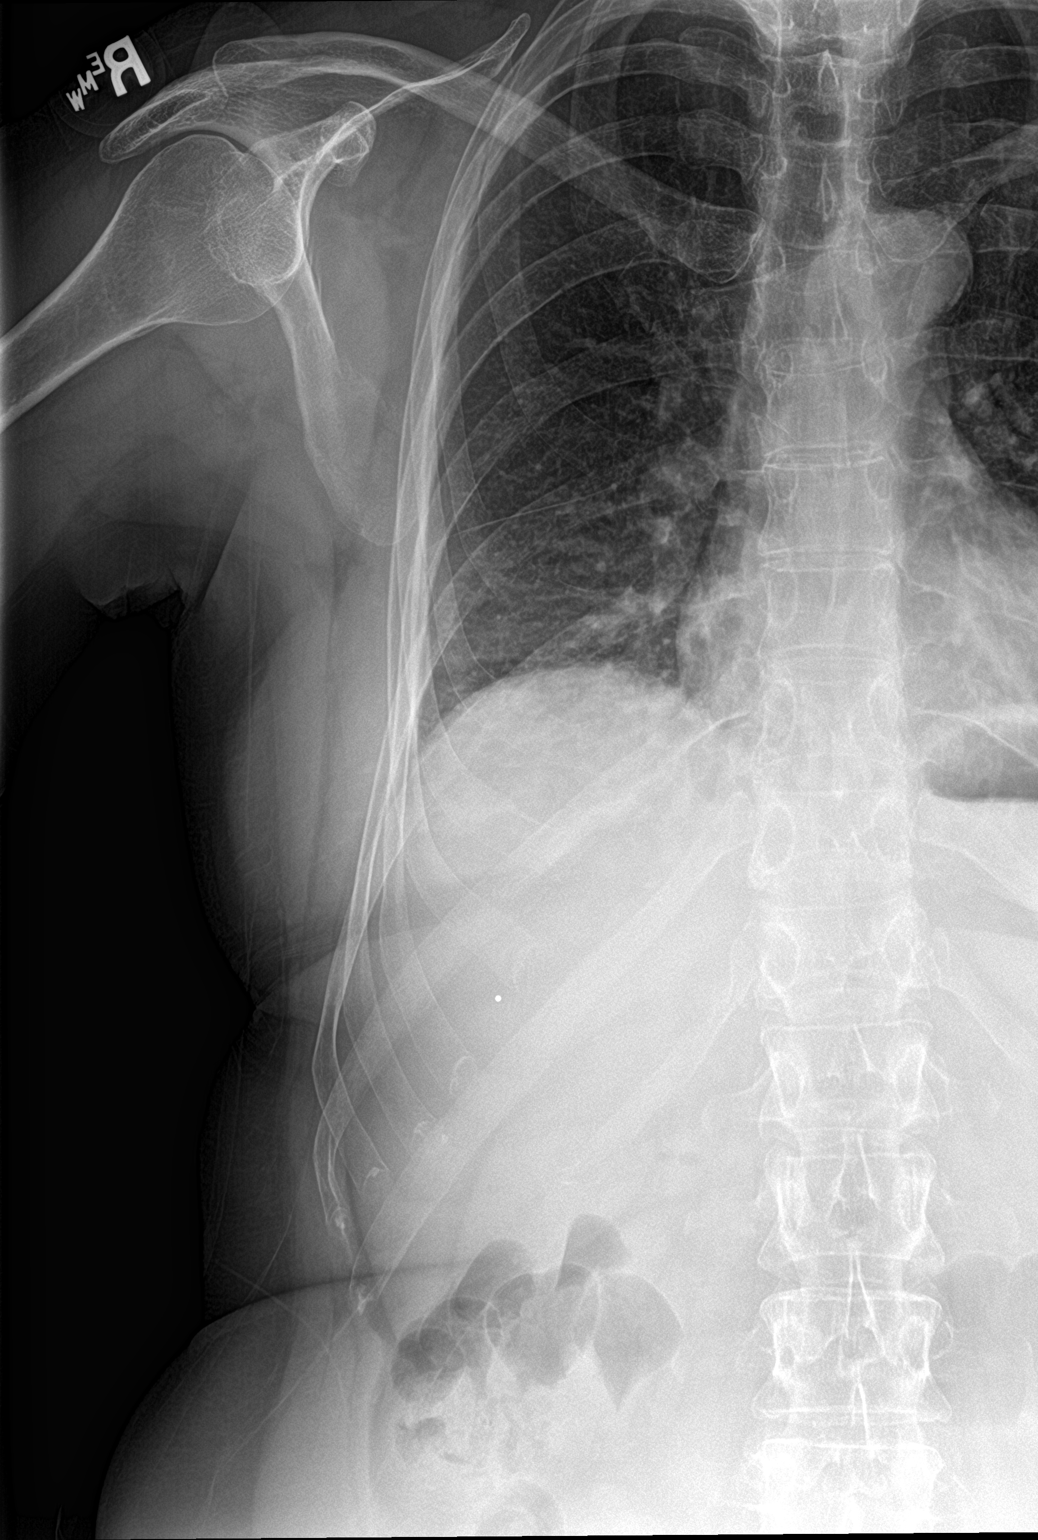

[rib pa obl]
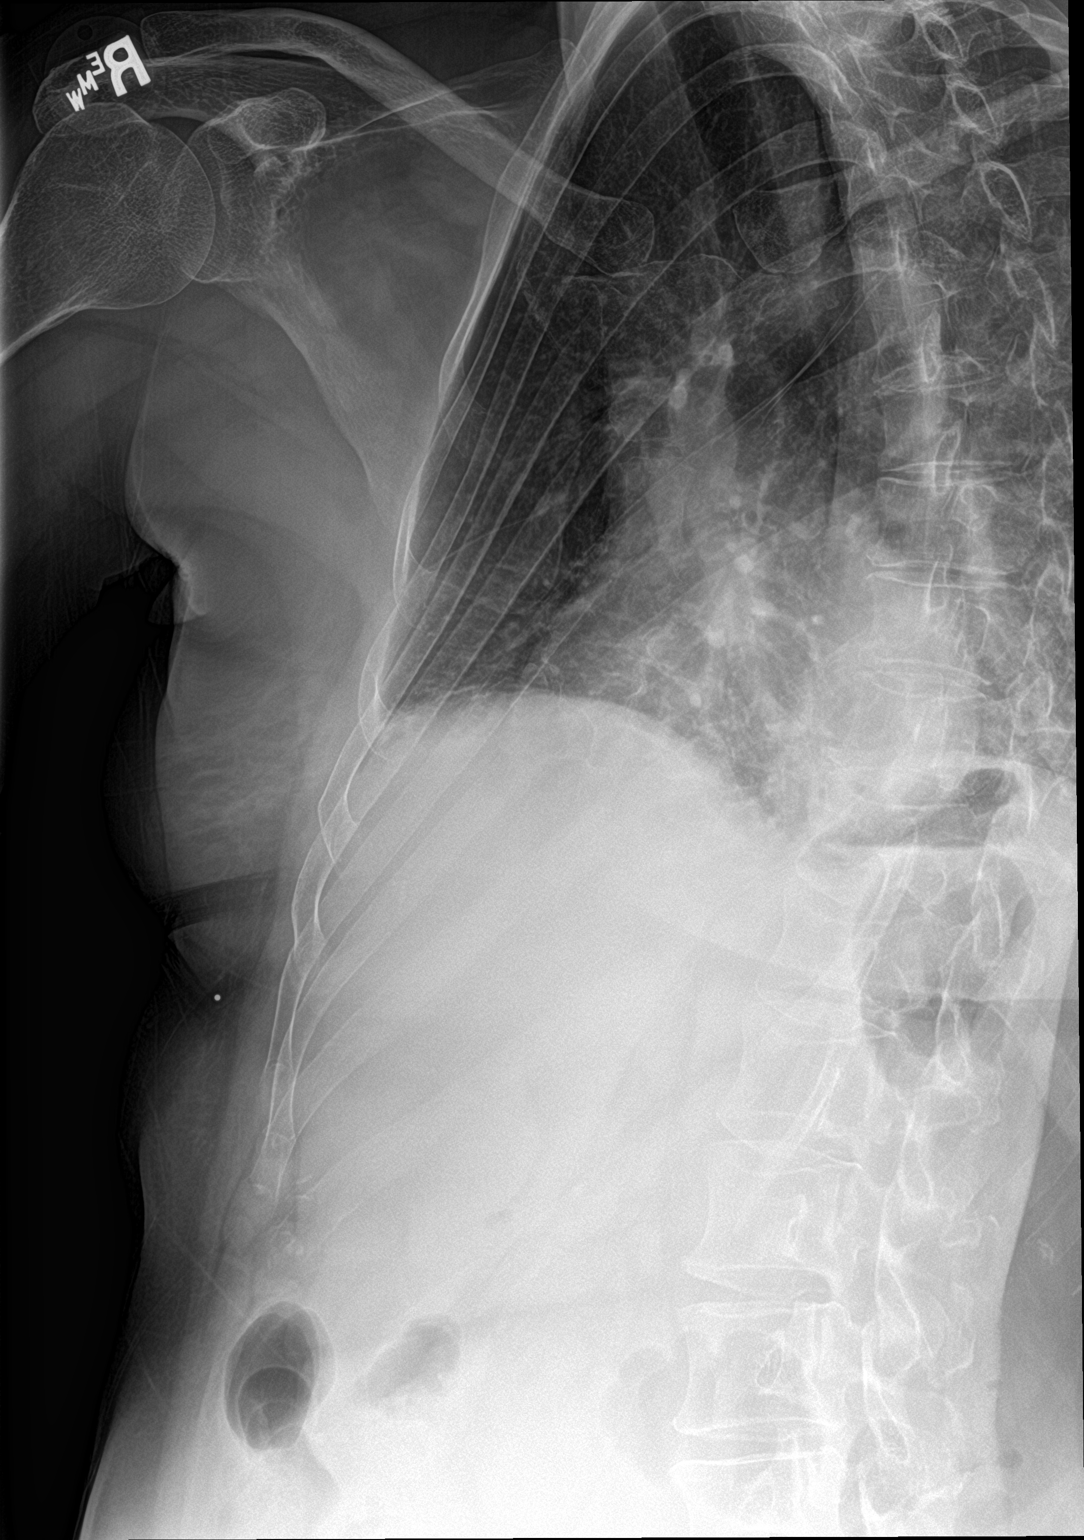

[3 of 3 positions shown; findings below may reference images not displayed]

FINDINGS: No visible rib fracture. No effusions or pneumothorax. Heart is
normal size. Linear densities in the lung bases could reflect
scarring or atelectasis.
IMPRESSION: No acute bony abnormality.  No visible rib fracture.

Bibasilar scarring or atelectasis.
